# Patient Record
Sex: Female | Born: 2000 | Race: White | Hispanic: No | Marital: Single | Smoking: Never smoker
Health system: Southern US, Community
[De-identification: ages and names within clinical notes are randomized; demographics above are authoritative.]

## PROBLEM LIST (undated history)

## (undated) DIAGNOSIS — F419 Anxiety disorder, unspecified: Secondary | ICD-10-CM

## (undated) DIAGNOSIS — F32A Depression, unspecified: Secondary | ICD-10-CM

---
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
## 2022-02-20 ENCOUNTER — Ambulatory Visit: Payer: BLUE CROSS/BLUE SHIELD | Admitting: Medical

## 2022-02-21 ENCOUNTER — Encounter: Payer: Self-pay | Admitting: Medical

## 2022-02-21 ENCOUNTER — Ambulatory Visit (INDEPENDENT_AMBULATORY_CARE_PROVIDER_SITE_OTHER): Payer: BLUE CROSS/BLUE SHIELD | Admitting: Medical

## 2022-02-21 ENCOUNTER — Other Ambulatory Visit: Payer: Self-pay

## 2022-02-21 VITALS — BP 108/68 | HR 88 | Temp 97.7°F | Ht 63.39 in | Wt 151.0 lb

## 2022-02-21 DIAGNOSIS — Z20822 Contact with and (suspected) exposure to covid-19: Secondary | ICD-10-CM

## 2022-02-21 NOTE — Progress Notes (Signed)
[facility] [address] [place], [place] [address] Phone: [phone] Fax: [phone]   Office Visit Note  Patient Name: Nicole Li  Date of [id number]  Med Rec number [id number]  Date of Service: 02/21/2022  [place]  Chief Complaint  Patient presents with   Covid Exposure    02/19/22    HPI Patient states she was exposed to friend 2 days ago (spent about 1-2 hours seated on same sofa) who has since been diagnosed with COVID-19. Friend developed symptoms 12-24 hours later. Patient is not vaccinated for COVID-19. Patient currently denies any symptoms. Denies any chronic medical problems or immune compromising conditions.   Current Medication:  Outpatient Encounter Medications as of 02/21/2022  Medication Sig   [DISCONTINUED] albuterol (VENTOLIN HFA) 108 (90 Base) MCG/ACT inhaler SMARTSIG:1-2 Puff(s) By Mouth Every 4-6 Hours PRN (Patient not taking: Reported on 02/21/2022)   [DISCONTINUED] FLUoxetine (PROZAC) 20 MG capsule Take 20 mg by mouth daily. (Patient not taking: Reported on 02/21/2022)   [DISCONTINUED] FLUoxetine (PROZAC) 40 MG capsule Take 40 mg by mouth daily. (Patient not taking: Reported on 02/21/2022)   [DISCONTINUED] methocarbamol (ROBAXIN) 500 MG tablet Take 1 tablet (500 mg total) by mouth 2 (two) times daily as needed for muscle spasms. (Patient not taking: Reported on 02/21/2022)   [DISCONTINUED] traMADol (ULTRAM) 50 MG tablet Take 1 tablet (50 mg total) by mouth every 6 (six) hours as needed. (Patient not taking: Reported on 02/21/2022)   No facility-administered encounter medications on file as of 02/21/2022.      Medical History: Past Medical History:  Diagnosis Date   Anxiety    Depression      Vital Signs: BP 108/68   Pulse 88   Temp 97.7 F (36.5 C) (Tympanic)   Ht 5' 3.39" (1.61 m)   Wt 151 lb (68.5 kg)   SpO2 98%   BMI 26.42 kg/m    Review of Systems  Constitutional:  Negative for chills, fatigue and fever.   HENT:  Negative for congestion, rhinorrhea and sore throat.   Eyes:  Negative for redness.  Respiratory:  Negative for cough.   Gastrointestinal:  Negative for diarrhea, nausea and vomiting.  Musculoskeletal:  Negative for myalgias.  Neurological:  Negative for headaches.    Physical Exam Constitutional:      General: She is not in acute distress.    Appearance: She is not ill-appearing.  HENT:     Mouth/Throat:     Mouth: Mucous membranes are moist.     Pharynx: Posterior oropharyngeal erythema (slight) present. No pharyngeal swelling.     Comments: Tonsils not enlarged/inflamed, no exudate. Musculoskeletal:     Cervical back: Neck supple.  Lymphadenopathy:     Cervical: No cervical adenopathy.  Neurological:     Mental Status: She is alert.     Assessment/Plan: 1. Contact with and (suspected) exposure to covid-19 -POCT COVID-19 antigen test performed today; RESULT: NEGATIVE; Patient not charged for test because test provided by [facility]. -Per CDC guidelines for COVID-19 close contacts without symptoms, you should repeat a rapid COVID-19 antigen test (may return to clinic for this or use at home test) or PCR test 5 days after your last close contact (02/24/22).  -You should wear a well-fitting mask for 10 days after your last close contact when in close contact with others. -Monitor yourself for symptoms (I.e. headache, fever, chills, sore throat, nasal congestion, runny nose). If you do develop symptoms, you should retest yourself. -Send my chart message to  provider with any questions or concerns.   General Counseling: Keyle verbalizes understanding of the findings of todays visit and agrees with plan of treatment. I have discussed any further diagnostic evaluation that may be needed or ordered today. We also reviewed her medications today. she has been encouraged to call the office with any questions or concerns that should arise related to todays visit.   No orders  of the defined types were placed in this encounter.   No orders of the defined types were placed in this encounter.   Time spent: 15 Minutes    [place] Tapp [employer] PA-C [facility] 02/21/2022 8:50 AM

## 2022-02-21 NOTE — Patient Instructions (Signed)
-  Per CDC guidelines for COVID-19 close contacts without symptoms, you should repeat a rapid COVID-19 antigen test (may return to clinic for this or use at home test) or PCR test 5 days after your last close contact (02/24/22).  -You should wear a well-fitting mask for 10 days after your last close contact when in close contact with others. -Monitor yourself for symptoms (I.e. headache, fever, chills, sore throat, nasal congestion, runny nose). If you do develop symptoms, you should retest yourself. -Send my chart message to provider with any questions or concerns.

## 2022-08-15 ENCOUNTER — Other Ambulatory Visit: Payer: Self-pay

## 2022-08-15 ENCOUNTER — Ambulatory Visit (INDEPENDENT_AMBULATORY_CARE_PROVIDER_SITE_OTHER): Payer: BLUE CROSS/BLUE SHIELD | Admitting: Medical

## 2022-08-15 ENCOUNTER — Encounter: Payer: Self-pay | Admitting: Medical

## 2022-08-15 VITALS — BP 110/74 | HR 102 | Temp 98.2°F | Ht 63.39 in | Wt 150.0 lb

## 2022-08-15 DIAGNOSIS — J069 Acute upper respiratory infection, unspecified: Secondary | ICD-10-CM

## 2022-08-15 NOTE — Progress Notes (Signed)
[address] [place], [place] [address] Phone: [phone] Fax: [phone]   Office Visit Note  Patient Name: Nicole Li  Date of [id number]  Med Rec number [id number]  Date of Service: 08/15/2022  Allergies: Penicillins  Chief Complaint  Patient presents with   sick     HPI 22 y.o. college student presents with sore throat and productive cough.  Sx began when she woke up 2 days ago. Has had sore throat, mildly painful to swallow. Has nasal congestion and runny nose. Some green nasal d/c. Cough has been somewhat productive of this as well. No fever or chills. Mild HA, no myalgias. No nausea, vomiting or diarrhea. Mild fatigue but not significantly more than usual.  Advil yesterday, no other meds.   Denies sick contacts. Had flu-like sx few weeks ago, roommate and friends had flu at the time so assumed she did too. Sx had resolved.    Current Medication:  Outpatient Encounter Medications as of 08/15/2022  Medication Sig   amphetamine-dextroamphetamine (ADDERALL) 20 MG tablet Take 20 mg by mouth daily.   venlafaxine XR (EFFEXOR-XR) 75 MG 24 hr capsule Take 75 mg by mouth daily.   No facility-administered encounter medications on file as of 08/15/2022.      Medical History: Past Medical History:  Diagnosis Date   Anxiety    Depression      Vital Signs: BP 110/74   Pulse (!) 102   Temp 98.2 F (36.8 C) (Tympanic)   Ht 5' 3.39" (1.61 m)   Wt 150 lb (68 kg)   SpO2 98%   BMI 26.25 kg/m    Review of Systems See HPI  Physical Exam Vitals reviewed.  Constitutional:      General: She is not in acute distress.    Appearance: She is not ill-appearing.     Comments: Tired appearing  HENT:     Head: Normocephalic.     Right Ear: Tympanic membrane, ear canal and external ear normal.     Left Ear: Tympanic membrane, ear canal and external ear normal.     Nose: Mucosal edema and congestion (mild) present. No rhinorrhea.      Right Sinus: No maxillary sinus tenderness or frontal sinus tenderness.     Left Sinus: No maxillary sinus tenderness or frontal sinus tenderness.     Mouth/Throat:     Mouth: Mucous membranes are moist. No oral lesions.     Pharynx: Posterior oropharyngeal erythema (mild) present. No pharyngeal swelling.     Tonsils: No tonsillar exudate. 1+ on the right. 1+ on the left.  Cardiovascular:     Rate and Rhythm: Normal rate and regular rhythm.     Heart sounds: No murmur heard.    No friction rub. No gallop.  Pulmonary:     Effort: Pulmonary effort is normal.     Breath sounds: Normal breath sounds. No wheezing, rhonchi or rales.  Musculoskeletal:     Cervical back: Neck supple. No rigidity.  Lymphadenopathy:     Cervical: Cervical adenopathy (small anterior cervical nodes, slightly tender) present.  Neurological:     Mental Status: She is alert.       Assessment/Plan: 1. Acute upper respiratory infection Suspect viral infection. Did discuss option for rapid strep/COVID-19/flu testing, pt declined. Discussed supportive and symptomatic treatment.  Patient Instructions:  -Rest and stay well hydrated (by drinking water and other liquids). Avoid/limit caffeine. -Take over-the-counter medicines (i.e. Dayquil, Nyquil, Ibuprofen) to help relieve your symptoms. -For your  sore throat/cough, use cough drops/throat lozenges, gargle warm salt water and/or drink warm liquids (like tea with honey). -Send MyChart message to provider or schedule return visit as needed for new/worsening symptoms (i.e. fever, increasing throat pain/difficulty swallowing) or if symptoms do not improve as discussed with recommended treatment over next 5-7 days.    General Counseling: Audrey verbalizes understanding of the findings of todays visit and agrees with plan of treatment. she has been encouraged to call the office with any questions or concerns that should arise related to todays visit.   Time spent:20  [place] PA-C [facility] 08/15/2022 4:20 PM

## 2022-08-20 NOTE — Patient Instructions (Signed)
-  Rest and stay well hydrated (by drinking water and other liquids). Avoid/limit caffeine. -Take over-the-counter medicines (i.e. Dayquil, Nyquil, Ibuprofen) to help relieve your symptoms. -For your sore throat/cough, use cough drops/throat lozenges, gargle warm salt water and/or drink warm liquids (like tea with honey). -Send MyChart message to provider or schedule return visit as needed for new/worsening symptoms (i.e. fever, increasing throat pain/difficulty swallowing) or if symptoms do not improve as discussed with recommended treatment over next 5-7 days.

## 2022-08-21 ENCOUNTER — Encounter: Payer: Self-pay | Admitting: Medical

## 2022-08-21 ENCOUNTER — Ambulatory Visit (INDEPENDENT_AMBULATORY_CARE_PROVIDER_SITE_OTHER): Payer: BLUE CROSS/BLUE SHIELD | Admitting: Medical

## 2022-08-21 ENCOUNTER — Other Ambulatory Visit: Payer: Self-pay

## 2022-08-21 VITALS — BP 108/73 | HR 83 | Temp 97.5°F | Ht 63.39 in | Wt 150.0 lb

## 2022-08-21 DIAGNOSIS — K1230 Oral mucositis (ulcerative), unspecified: Secondary | ICD-10-CM

## 2022-08-21 MED ORDER — NYSTATIN 100000 UNIT/ML MT SUSP: 5.0000 mL | Freq: Four times a day (QID) | OROMUCOSAL | 0 refills | Status: DC | PRN

## 2022-08-21 NOTE — Progress Notes (Unsigned)
[facility] [place], [place] [address] Phone: [phone] Fax: [phone]   Office Visit Note  Patient Name: Nicole Li  Date of [id number]  Med Rec number [id number]  Date of Service: 08/21/2022  Allergies: Penicillins  Chief Complaint  Patient presents with   sick     HPI 22 y.o. college student presents with mouth/throat pain.  Seen 6 days ago with URI sx. Noting increased pain to throat on right side in last 2-3 days. Tonsils not inflamed, notes red bump inside mouth. Pain radiates to right ear somewhat. No fever or chills. Nasal congestion, rhinorrhea and cough have all improved. No increased fatigue. Some painful to swallow.   No otc meds currently.   Current Medication:  Outpatient Encounter Medications as of 08/21/2022  Medication Sig   amphetamine-dextroamphetamine (ADDERALL) 20 MG tablet Take 20 mg by mouth daily.   venlafaxine XR (EFFEXOR-XR) 75 MG 24 hr capsule Take 75 mg by mouth daily.   No facility-administered encounter medications on file as of 08/21/2022.      Medical History: Past Medical History:  Diagnosis Date   Anxiety    Depression      Vital Signs: BP 108/73   Pulse 83   Temp (!) 97.5 F (36.4 C) (Tympanic)   Ht 5' 3.39" (1.61 m)   Wt 150 lb (68 kg)   SpO2 98%   BMI 26.25 kg/m    Review of Systems See HPI  Physical Exam Vitals reviewed.  Constitutional:      General: She is not in acute distress.    Appearance: She is not ill-appearing.     Comments: Tired appearing  HENT:     Head: Normocephalic.     Right Ear: Tympanic membrane, ear canal and external ear normal.     Left Ear: Tympanic membrane, ear canal and external ear normal.     Nose: No congestion or rhinorrhea.     Mouth/Throat:     Mouth: Mucous membranes are moist.     Pharynx: No pharyngeal swelling or posterior oropharyngeal erythema.     Tonsils: No tonsillar exudate. 1+ on the right. 1+ on the left.     Comments:  Mild erythema to right palatoglossal arch area. No specific lesions noted. Musculoskeletal:     Cervical back: Neck supple. No rigidity.  Lymphadenopathy:     Cervical: Cervical adenopathy (small anterior cervical nodes, slightly tender on right) present.  Neurological:     Mental Status: She is alert.     Assessment/Plan: 1. Oral mucositis Discussed overall reassuring exam findings. Offered option for rapid strep A antigen test, patient declined. Will treat supportively and try Magic Mouthwash for relief of pain/inflammation.   - magic mouthwash (nystatin, hydrocortisone, diphenhydrAMINE) suspension; Swish and spit 5 mLs 4 (four) times daily as needed for mouth pain.  Dispense: 240 mL; Refill: 0  Patient Instructions:  -Try "magic mouthwash" for mouth pain after meals and before bedtime. --Rest and stay well hydrated (by drinking water and other liquids). ] -Take over-the-counter medicines (i.e. Ibuprofen) to help relieve your symptoms. -For your sore throat/mouth, use throat lozenges, gargle warm salt water and/or drink warm liquids (like tea with honey). -Send MyChart message to provider or schedule return visit as needed for new/worsening symptoms (i.e. fever, increasing throat pain) or if symptoms do not improve as discussed with recommended treatment over next 5-7 days.     General Counseling: Nicole Li verbalizes understanding of the findings of todays visit and agrees with  plan of treatment. she has been encouraged to call the office with any questions or concerns that should arise related to todays visit.   Time spent:20 [place] PA-C [place] 08/21/2022 9:52 AM

## 2022-08-24 ENCOUNTER — Encounter: Payer: Self-pay | Admitting: Medical

## 2022-08-24 NOTE — Patient Instructions (Signed)
-  Try "magic mouthwash" for mouth pain after meals and before bedtime. --Rest and stay well hydrated (by drinking water and other liquids). ] -Take over-the-counter medicines (i.e. Ibuprofen) to help relieve your symptoms. -For your sore throat/mouth, use throat lozenges, gargle warm salt water and/or drink warm liquids (like tea with honey). -Send MyChart message to provider or schedule return visit as needed for new/worsening symptoms (i.e. fever, increasing throat pain) or if symptoms do not improve as discussed with recommended treatment over next 5-7 days.

## 2022-08-25 ENCOUNTER — Ambulatory Visit (INDEPENDENT_AMBULATORY_CARE_PROVIDER_SITE_OTHER): Payer: BLUE CROSS/BLUE SHIELD | Admitting: Medical

## 2022-08-25 ENCOUNTER — Encounter: Payer: Self-pay | Admitting: Medical

## 2022-08-25 ENCOUNTER — Other Ambulatory Visit: Payer: Self-pay

## 2022-08-25 VITALS — BP 108/61 | HR 88 | Temp 98.4°F | Ht 63.39 in | Wt 150.0 lb

## 2022-08-25 DIAGNOSIS — J029 Acute pharyngitis, unspecified: Secondary | ICD-10-CM

## 2022-08-25 LAB — POCT RAPID STREP A (OFFICE): Rapid Strep A Screen: NEGATIVE

## 2022-08-25 NOTE — Progress Notes (Signed)
[facility]. [place], [place] [address] Phone: [phone] Fax: [phone]   Office Visit Note  Patient Name: Nicole Li  Date of [id number]  Med Rec number [id number]  Date of Service: 08/25/2022  Allergies: Penicillins  Chief Complaint  Patient presents with   Sore Throat     HPI 22 y.o. college student presents for follow up of throat pain.  See previous notes. Throat hurting same as it was few days ago, hurts to swallow. No fever or chills. Right ear also some painful, not clogged or congested. Not more fatigued than usual.   Has never had mono. Had strep throat in Dec.   No otc analgesics, Magic mouthwash given at prior visit not very helpful.    Current Medication:  Outpatient Encounter Medications as of 08/25/2022  Medication Sig   amphetamine-dextroamphetamine (ADDERALL) 20 MG tablet Take 20 mg by mouth daily.   magic mouthwash (nystatin, hydrocortisone, diphenhydrAMINE) suspension Swish and spit 5 mLs 4 (four) times daily as needed for mouth pain.   venlafaxine XR (EFFEXOR-XR) 75 MG 24 hr capsule Take 75 mg by mouth daily.   No facility-administered encounter medications on file as of 08/25/2022.      Medical History: Past Medical History:  Diagnosis Date   Anxiety    Depression      Vital Signs: BP 108/61   Pulse 88   Temp 98.4 F (36.9 C) (Tympanic)   Ht 5' 3.39" (1.61 m)   Wt 150 lb (68 kg)   SpO2 99%   BMI 26.25 kg/m    Review of Systems See HPI  Physical Exam Vitals reviewed.  Constitutional:      General: She is not in acute distress.    Appearance: She is not ill-appearing.  HENT:     Head: Normocephalic.     Right Ear: Ear canal and external ear normal.     Left Ear: Tympanic membrane, ear canal and external ear normal.     Ears:     Comments: Right TM dull, small serous middle ear fluid on right    Nose: Mucosal edema (mild) present. No congestion or rhinorrhea.     Mouth/Throat:     Mouth:  Mucous membranes are moist. No oral lesions.     Pharynx: Posterior oropharyngeal erythema (slight generalized erythema to pharynx) present. No pharyngeal swelling.     Tonsils: No tonsillar exudate. 1+ on the right. 1+ on the left.     Comments: Mildly tender to right palatoglossal arch Cardiovascular:     Rate and Rhythm: Normal rate and regular rhythm.     Heart sounds: No murmur heard.    No friction rub. No gallop.  Pulmonary:     Effort: Pulmonary effort is normal.     Breath sounds: Normal breath sounds. No wheezing, rhonchi or rales.  Musculoskeletal:     Cervical back: Neck supple. No rigidity.  Lymphadenopathy:     Cervical: Cervical adenopathy (1+ anterior nodes, tender on right.) present.  Neurological:     Mental Status: She is alert.    Results for orders placed or performed in visit on 08/25/22 (from the past 24 hour(s))  POCT rapid strep A     Status: Normal   Collection Time: 08/25/22  2:15 PM  Result Value Ref Range   Rapid Strep A Screen Negative Negative      Assessment/Plan: 1. Pharyngitis, unspecified etiology POC Strep A antigen test negative. Exam stable from previous visit. No obvious source  for pain identified on exam. Will check strep culture. Encouraged over-the-counter anti-inflammatory medicine and supportive measures.   - POCT rapid strep A - Culture, Group A Strep   Patient Instructions:  -Take an over-the-counter pain reliever/anti-inflammatory (such as ibuprofen) to help relieve pain or fever.   -You may continue to use the Magic Mouthwash as needed. -Rest and drink plenty of water.  Drinking warm or cold liquids (such as tea, soup or smoothies) and eating soft foods (such as oatmeal) may be more comfortable until your throat pain improves.    -You will receive a MyChart message notifying you of your lab results when they are available.  -Send MyChart message to provider or schedule return appointment in meantime as needed for new/worsening  symptoms (such as increased throat pain or difficulty swallowing, fever).   General Counseling: Anahita verbalizes understanding of the findings of todays visit and agrees with plan of treatment. she has been encouraged to call the office with any questions or concerns that should arise related to todays visit.   Time spent:20 [place] PA-C [place] 08/25/2022 1:56 PM

## 2022-08-28 LAB — CULTURE, GROUP A STREP: Strep A Culture: NEGATIVE

## 2022-09-01 ENCOUNTER — Encounter: Payer: Self-pay | Admitting: Medical

## 2022-09-01 NOTE — Patient Instructions (Signed)
-  Take an over-the-counter pain reliever/anti-inflammatory (such as ibuprofen) to help relieve pain or fever.   -You may continue to use the Magic Mouthwash as needed. -Rest and drink plenty of water.  Drinking warm or cold liquids (such as tea, soup or smoothies) and eating soft foods (such as oatmeal) may be more comfortable until your throat pain improves.    -You will receive a MyChart message notifying you of your lab results when they are available.  -Send MyChart message to provider or schedule return appointment in meantime as needed for new/worsening symptoms (such as increased throat pain or difficulty swallowing, fever).

## 2022-09-29 ENCOUNTER — Ambulatory Visit: Payer: BLUE CROSS/BLUE SHIELD | Admitting: Oncology

## 2022-10-10 ENCOUNTER — Other Ambulatory Visit: Payer: Self-pay

## 2022-10-10 ENCOUNTER — Encounter: Payer: Self-pay | Admitting: Medical

## 2022-10-10 ENCOUNTER — Ambulatory Visit (INDEPENDENT_AMBULATORY_CARE_PROVIDER_SITE_OTHER): Payer: BLUE CROSS/BLUE SHIELD | Admitting: Medical

## 2022-10-10 VITALS — BP 110/78 | HR 94 | Temp 97.8°F | Wt 142.0 lb

## 2022-10-10 DIAGNOSIS — R112 Nausea with vomiting, unspecified: Secondary | ICD-10-CM

## 2022-10-10 DIAGNOSIS — R509 Fever, unspecified: Secondary | ICD-10-CM

## 2022-10-10 DIAGNOSIS — R1030 Lower abdominal pain, unspecified: Secondary | ICD-10-CM | POA: Diagnosis not present

## 2022-10-10 DIAGNOSIS — J029 Acute pharyngitis, unspecified: Secondary | ICD-10-CM

## 2022-10-10 DIAGNOSIS — Z113 Encounter for screening for infections with a predominantly sexual mode of transmission: Secondary | ICD-10-CM

## 2022-10-10 LAB — POCT URINALYSIS DIPSTICK (MANUAL)
Nitrite, UA: NEGATIVE
Poct Bilirubin: NEGATIVE
Poct Glucose: NORMAL mg/dL
Poct Protein: 30 mg/dL — AB
Poct Urobilinogen: NORMAL mg/dL
Spec Grav, UA: 1.02 (ref 1.010–1.025)
pH, UA: 6.5 (ref 5.0–8.0)

## 2022-10-10 LAB — POCT RAPID STREP A (OFFICE): Rapid Strep A Screen: NEGATIVE

## 2022-10-10 MED ORDER — CEFDINIR 300 MG PO CAPS: 300.0000 mg | ORAL_CAPSULE | Freq: Two times a day (BID) | ORAL | 0 refills | Status: AC

## 2022-10-10 MED ORDER — ONDANSETRON 4 MG PO TBDP: 4.0000 mg | ORAL_TABLET | Freq: Three times a day (TID) | ORAL | 0 refills | Status: AC | PRN

## 2022-10-10 NOTE — Progress Notes (Signed)
[facility] [address] [place], [place] [address] Phone: [phone] Fax: [phone]   Office Visit Note  Patient Name: Nicole Li  Date of [id number]  Med Rec number [id number]  Date of Service: 10/10/2022  Allergies: Penicillins  Chief Complaint  Patient presents with   sick     HPI 22 y.o. college student presents with vomiting and fever.  Felt normal yesterday AM. Drank smoothie and ate sandwich from [employer] last night. Had abrupt onset of nausea and then vomited about half hour later. Vomited twice more last night. Had eaten crackers and ginger ale. Also developed fever (101+) last night and again this AM. Sweating overnight.  Mild nausea today, no further vomiting. Had diarrhea x 1 yesterday evening. Also developed sore throat last night with painful swallowing. Also has HA and myalgias. Mild nasal congestion. No cough.  Denies sick contacts. Does not recall sharing anything. Bites her nails often.   Has had some lower back and abdomen pain for about 3 days, better as of yesterday afternoon. No dysuria, urgency, frequency or vaginal discharge. Has had UTI once before. No new sexual partners. Has not had sex last 6 weeks. Thinks she had STI testing 2-3 years ago.  Taking Tylenol and Advil. Last Tylenol about 3 hrs ago. No recent abx.    Current Medication:  Outpatient Encounter Medications as of 10/10/2022  Medication Sig   amphetamine-dextroamphetamine (ADDERALL) 20 MG tablet Take 20 mg by mouth daily.   venlafaxine XR (EFFEXOR-XR) 75 MG 24 hr capsule Take 75 mg by mouth daily.   [DISCONTINUED] magic mouthwash (nystatin, hydrocortisone, diphenhydrAMINE) suspension Swish and spit 5 mLs 4 (four) times daily as needed for mouth pain.   No facility-administered encounter medications on file as of 10/10/2022.      Medical History: Past Medical History:  Diagnosis Date   Anxiety    Depression      Vital Signs: BP 110/78   Pulse 94    Temp 97.8 F (36.6 C) (Tympanic)   Wt 142 lb (64.4 kg)   SpO2 98%   BMI 24.85 kg/m    Review of Systems See HPI  Physical Exam Vitals reviewed.  Constitutional:      General: She is not in acute distress.    Appearance: She is not ill-appearing.     Comments: Tired appearing  HENT:     Head: Normocephalic.     Right Ear: Tympanic membrane, ear canal and external ear normal.     Left Ear: Tympanic membrane, ear canal and external ear normal.     Nose: No mucosal edema, congestion or rhinorrhea.     Mouth/Throat:     Mouth: Mucous membranes are moist. No oral lesions.     Pharynx: Posterior oropharyngeal erythema (mild) present. No pharyngeal swelling.     Tonsils: No tonsillar exudate. 1+ on the right. 1+ on the left.  Cardiovascular:     Rate and Rhythm: Normal rate and regular rhythm.     Heart sounds: No murmur heard.    No friction rub. No gallop.  Pulmonary:     Effort: Pulmonary effort is normal.     Breath sounds: Normal breath sounds. No wheezing, rhonchi or rales.  Abdominal:     General: Abdomen is flat. Bowel sounds are normal. There is no distension.     Palpations: Abdomen is soft.     Tenderness: There is generalized abdominal tenderness (mild). There is right CVA tenderness (slight) and left CVA tenderness (slight). There  is no guarding.  Musculoskeletal:     Cervical back: Neck supple. No rigidity.  Lymphadenopathy:     Cervical: Cervical adenopathy (small anterior nodes, slightly tender) present.  Neurological:     Mental Status: She is alert.     Results for orders placed or performed in visit on 10/10/22 (from the past 24 hour(s))  POCT rapid strep A     Status: Normal   Collection Time: 10/10/22  2:39 PM  Result Value Ref Range   Rapid Strep A Screen Negative Negative  POCT Urinalysis Dip Manual     Status: Abnormal   Collection Time: 10/10/22  3:16 PM  Result Value Ref Range   Spec Grav, UA 1.020 1.010 - 1.025   pH, UA 6.5 5.0 - 8.0    Leukocytes, UA Moderate (2+) (A) Negative   Nitrite, UA Negative Negative   Poct Protein +30 (A) Negative, trace mg/dL   Poct Glucose Normal Normal mg/dL   Poct Ketones ++ moderate (A) Negative   Poct Urobilinogen Normal Normal mg/dL   Poct Bilirubin Negative Negative   Poct Blood trace Negative, trace     Assessment/Plan: 1. Fever, unspecified fever cause 2. Pharyngitis, unspecified etiology 3. Lower abdominal pain 4. Nausea and vomiting, unspecified vomiting type 5. Screening examination for STD (sexually transmitted disease) Clinical findings and urinalysis result concerning for possible urinary tract infection. Early respiratory infection also possible. Will cover with antibiotics. Will give Zofran to take as needed for nausea and vomiting. Also discussed over-the-counter decongestant and analgesic as needed. Will send urine for culture and STI screening.    - POCT rapid strep A - POCT Urinalysis Dip Manual - Chlamydia/Gonococcus/Trichomonas, NAA - Urine Culture - cefdinir (OMNICEF) 300 MG capsule; Take 1 capsule (300 mg total) by mouth 2 (two) times daily for 7 days.  Dispense: 14 capsule; Refill: 0 - ondansetron (ZOFRAN-ODT) 4 MG disintegrating tablet; Take 1 tablet (4 mg total) by mouth every 8 (eight) hours as needed for nausea or vomiting.  Dispense: 15 tablet; Refill: 0  Patient Instructions:  -Take complete course of antibiotics as prescribed.  Take with food.  -Use Ondansetron every 8 hours as needed for nausea or vomiting.  -Drink plenty of water. Avoid or limit alcohol and caffeine, which may make symptoms worse. -Avoid spicy, fried or acidic (i.e. tomato, tomato sauce, citrus) foods. -Take over-the-counter medicines (i.e. Tylenol) to help relieve your symptoms.  -You will receive a MyChart message notifying you of your lab results when they are available.  -Send MyChart message to provider or schedule return appointment as needed for new/worsening symptoms (such as  increased pain, persistent vomiting) if your symptoms are not improving after 2-3 days taking antibiotics.   General Counseling: Nicole Li verbalizes understanding of the findings of todays visit and agrees with plan of treatment. she has been encouraged to call the office with any questions or concerns that should arise related to todays visit.   Orders Placed This Encounter  Procedures   Chlamydia/Gonococcus/Trichomonas, NAA   Urine Culture   POCT rapid strep A   POCT Urinalysis Dip Manual    Meds ordered this encounter  Medications   cefdinir (OMNICEF) 300 MG capsule    Sig: Take 1 capsule (300 mg total) by mouth 2 (two) times daily for 7 days.    Dispense:  14 capsule    Refill:  0    Order Specific Question:   Supervising Provider    Answer:   Noralee Stain [[id number]]   ondansetron (  ZOFRAN-ODT) 4 MG disintegrating tablet    Sig: Take 1 tablet (4 mg total) by mouth every 8 (eight) hours as needed for nausea or vomiting.    Dispense:  15 tablet    Refill:  0    Order Specific Question:   Supervising Provider    Answer:   Noralee Stain [[id number]]    Time spent:25 Minutes    Jonathon Resides PA-C [facility] 10/10/2022 2:14 PM

## 2022-10-11 ENCOUNTER — Other Ambulatory Visit: Payer: Self-pay

## 2022-10-11 ENCOUNTER — Emergency Department: Payer: BLUE CROSS/BLUE SHIELD

## 2022-10-11 ENCOUNTER — Emergency Department
Admission: EM | Admit: 2022-10-11 | Discharge: 2022-10-11 | Disposition: A | Discharge status: Home / Self Care | Payer: BLUE CROSS/BLUE SHIELD | Attending: Emergency Medicine | Admitting: Emergency Medicine

## 2022-10-11 DIAGNOSIS — R102 Pelvic and perineal pain: Secondary | ICD-10-CM | POA: Diagnosis present

## 2022-10-11 DIAGNOSIS — N390 Urinary tract infection, site not specified: Secondary | ICD-10-CM

## 2022-10-11 LAB — WET PREP, GENITAL
Clue Cells Wet Prep HPF POC: NONE SEEN
Sperm: NONE SEEN
Trich, Wet Prep: NONE SEEN
WBC, Wet Prep HPF POC: >=10 — AB (ref <10)
Yeast Wet Prep HPF POC: NONE SEEN

## 2022-10-11 LAB — URINALYSIS, ROUTINE W REFLEX MICROSCOPIC
Bacteria, UA: NONE SEEN
Bilirubin Urine: NEGATIVE
Glucose, UA: NEGATIVE mg/dL
Hgb urine dipstick: NEGATIVE
Ketones, ur: 20 mg/dL — AB
Nitrite: NEGATIVE
Protein, ur: 30 mg/dL — AB
Specific Gravity, Urine: 1.031 — ABNORMAL HIGH (ref 1.005–1.030)
pH: 5 (ref 5.0–8.0)

## 2022-10-11 LAB — CBC
HCT: 38.9 % (ref 36.0–46.0)
Hemoglobin: 12.2 g/dL (ref 12.0–15.0)
MCH: 27.2 pg (ref 26.0–34.0)
MCHC: 31.4 g/dL (ref 30.0–36.0)
MCV: 86.8 fL (ref 80.0–100.0)
Platelets: 320 10*3/uL (ref 150–400)
RBC: 4.48 MIL/uL (ref 3.87–5.11)
RDW: 13.9 % (ref 11.5–15.5)
WBC: 17.5 10*3/uL — ABNORMAL HIGH (ref 4.0–10.5)
nRBC: 0 % (ref 0.0–0.2)

## 2022-10-11 LAB — COMPREHENSIVE METABOLIC PANEL
ALT: 13 U/L (ref 0–44)
AST: 20 U/L (ref 15–41)
Albumin: 4.4 g/dL (ref 3.5–5.0)
Alkaline Phosphatase: 81 U/L (ref 38–126)
Anion gap: 9 (ref 5–15)
BUN: 12 mg/dL (ref 6–20)
CO2: 24 mmol/L (ref 22–32)
Calcium: 9.1 mg/dL (ref 8.9–10.3)
Chloride: 105 mmol/L (ref 98–111)
Creatinine, Ser: 0.58 mg/dL (ref 0.44–1.00)
GFR, Estimated: >60 mL/min (ref >60)
Glucose, Bld: 110 mg/dL — ABNORMAL HIGH (ref 70–99)
Potassium: 3.7 mmol/L (ref 3.5–5.1)
Sodium: 138 mmol/L (ref 135–145)
Total Bilirubin: 0.5 mg/dL (ref 0.3–1.2)
Total Protein: 8 g/dL (ref 6.5–8.1)

## 2022-10-11 LAB — POC URINE PREG, ED: Preg Test, Ur: NEGATIVE

## 2022-10-11 LAB — LIPASE, BLOOD: Lipase: 38 U/L (ref 11–51)

## 2022-10-11 LAB — CHLAMYDIA/NGC RT PCR (ARMC ONLY)
Chlamydia Tr: NOT DETECTED
N gonorrhoeae: NOT DETECTED

## 2022-10-11 MED ORDER — SODIUM CHLORIDE 0.9 % IV BOLUS
500.0000 mL | Freq: Once | INTRAVENOUS | Status: AC
  Administered 2022-10-11: 500 mL via INTRAVENOUS

## 2022-10-11 MED ORDER — KETOROLAC TROMETHAMINE 30 MG/ML IJ SOLN
30.0000 mg | Freq: Once | INTRAMUSCULAR | Status: AC
  Administered 2022-10-11: 30 mg via INTRAVENOUS
  Filled 2022-10-11: qty 1

## 2022-10-11 NOTE — ED Provider Notes (Signed)
[facility] Provider Note    Event Date/Time   First MD Initiated Contact with Patient 10/11/22 0800     (approximate)   History   Abdominal Pain   HPI  Nicole Li is a 22 y.o. female with no history of abdominal surgery who presents with several days of abdominal pain.  She describes it started as lower abdominal pain, now predominantly in the left lower quadrant and some discomfort in the left flank.  She denies dysuria but reports decreased urine output.  No fevers reported.  No nausea or vomiting.  No history of kidney stones.     Physical Exam   Triage Vital Signs: ED Triage Vitals [10/11/22 0748]  Enc Vitals Group     BP 128/75     Pulse Rate 75     Resp 16     Temp 98.4 F (36.9 C)     Temp Source Oral     SpO2 100 %     Weight 59 kg (130 lb)     Height 1.6 m ( )     Head Circumference      Peak Flow      Pain Score 4     Pain Loc      Pain Edu?      Excl. in GC?     Most recent vital signs: Vitals:   10/11/22 0748  BP: 128/75  Pulse: 75  Resp: 16  Temp: 98.4 F (36.9 C)  SpO2: 100%     General: Awake, no distress.  CV:  Good peripheral perfusion.  Resp:  Normal effort.  Abd:  No distention.  Mild left CVA tenderness, mild left lower quadrant tenderness Other:     ED Results / Procedures / Treatments   Labs (all labs ordered are listed, but only abnormal results are displayed) Labs Reviewed  WET PREP, GENITAL - Abnormal; Notable for the following components:      Result Value   WBC, Wet Prep HPF POC >=10 (*)    All other components within normal limits  COMPREHENSIVE METABOLIC PANEL - Abnormal; Notable for the following components:   Glucose, Bld 110 (*)    All other components within normal limits  CBC - Abnormal; Notable for the following components:   WBC 17.5 (*)    All other components within normal limits  URINALYSIS, ROUTINE W REFLEX MICROSCOPIC - Abnormal; Notable for the following  components:   Color, Urine YELLOW (*)    APPearance HAZY (*)    Specific Gravity, Urine 1.031 (*)    Ketones, ur 20 (*)    Protein, ur 30 (*)    Leukocytes,Ua TRACE (*)    All other components within normal limits  CHLAMYDIA/NGC RT PCR ([facility] ONLY)            LIPASE, BLOOD  POC URINE PREG, ED     EKG     RADIOLOGY CT renal stone study viewed interpret by me, no ureterolithiasis, pending radiology review    PROCEDURES:  Critical Care performed:   Procedures   MEDICATIONS ORDERED IN ED: Medications  sodium chloride 0.9 % bolus 500 mL (0 mLs Intravenous Stopped 10/11/22 0848)  ketorolac (TORADOL) 30 MG/ML injection 30 mg (30 mg Intravenous Given 10/11/22 0848)     IMPRESSION / MDM / ASSESSMENT AND PLAN / ED COURSE  I reviewed the triage vital signs and the nursing notes. Patient's presentation is most consistent with acute presentation with potential threat to life or  bodily function.  Patient presents with lower abdominal pain predominantly on the left as described above.  Differential includes UTI, pyelonephritis, ectopic pregnancy, ureterolithiasis, less likely diverticulitis given her age  Will obtain labs, urinalysis, pregnancy test.  Will place IV give IV fluids and analgesics and consider imaging  CT renal stone study negative for ureterolithiasis, will send for ultrasound as she does describe some vaginal discharge  Urinalysis possibly consistent with UTI  Pelvic performed, some yellowish discharge, wet prep demonstrates elevated white blood cells  Ultrasound is negative for TOA or evidence of torsion  Gonorrhea chlamydia negative  Patient has prescription for antibiotics for UTI prescribed by urgent care, I recommend that she take these, she is feeling much improved, return precautions discussed, outpatient follow-up with GYN as needed        FINAL CLINICAL IMPRESSION(S) / ED DIAGNOSES   Final diagnoses:  Lower urinary tract infectious disease   Pelvic pain in female     Rx / DC Orders   ED Discharge Orders     None        Note:  This document was prepared using Dragon voice recognition software and may include unintentional dictation errors.   Jene Every, MD 10/11/22 1140

## 2022-10-11 NOTE — ED Triage Notes (Signed)
Pt states llq pain for several days with possible fever and vomiting x3 on Friday. Pt states pain radiates to her back, appears in no acute distress.

## 2022-10-12 DIAGNOSIS — N898 Other specified noninflammatory disorders of vagina: Secondary | ICD-10-CM

## 2022-10-12 LAB — CHLAMYDIA/GONOCOCCUS/TRICHOMONAS, NAA
Chlamydia by NAA: NEGATIVE
Gonococcus by NAA: NEGATIVE
Trich vag by NAA: NEGATIVE

## 2022-10-12 LAB — URINE CULTURE

## 2022-10-15 ENCOUNTER — Encounter: Payer: Self-pay | Admitting: Medical

## 2022-10-15 NOTE — Patient Instructions (Signed)
-  Take complete course of antibiotics as prescribed.  Take with food.  -Use Ondansetron every 8 hours as needed for nausea or vomiting.  -Drink plenty of water. Avoid or limit alcohol and caffeine, which may make symptoms worse. -Avoid spicy, fried or acidic (i.e. tomato, tomato sauce, citrus) foods. -Take over-the-counter medicines (i.e. Tylenol) to help relieve your symptoms.  -You will receive a MyChart message notifying you of your lab results when they are available.  -Send MyChart message to provider or schedule return appointment as needed for new/worsening symptoms (such as increased pain, persistent vomiting) if your symptoms are not improving after 2-3 days taking antibiotics.

## 2022-10-20 MED ORDER — FLUCONAZOLE 150 MG PO TABS
150.0000 mg | ORAL_TABLET | Freq: Once | ORAL | 0 refills | Status: AC
Start: 2022-10-20 — End: 2022-10-20

## 2024-03-02 IMAGING — CT CT ANGIO CHEST
2 of 6 series · 18 of 46 positions shown · IV contrast (APPLIED)
Comparison: Plain films of the ribs and chest of 09/12/2021.

CLINICAL DATA: Left rib pain for 3 weeks.

EXAM:
CT ANGIOGRAPHY CHEST WITH CONTRAST
TECHNIQUE: Multidetector CT imaging of the chest was performed using the
standard protocol during bolus administration of intravenous
contrast. Multiplanar CT image reconstructions and MIPs were
obtained to evaluate the vascular anatomy.

[Series 6: thins · axial · 0.62mm/px · z∈[-582,-354]mm · 15 of 358 slices shown]
[im 16/358  lung]
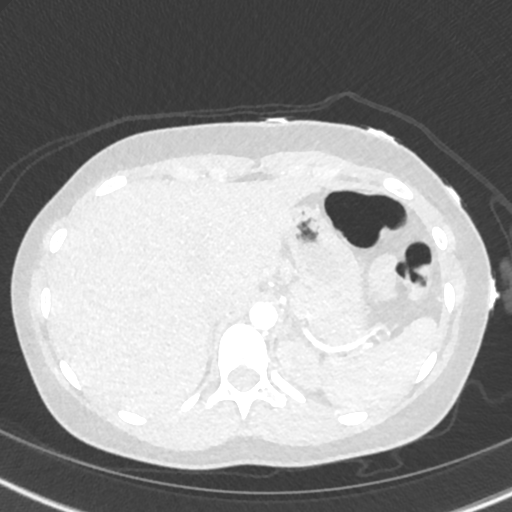
[im 47/358  soft-tissue]
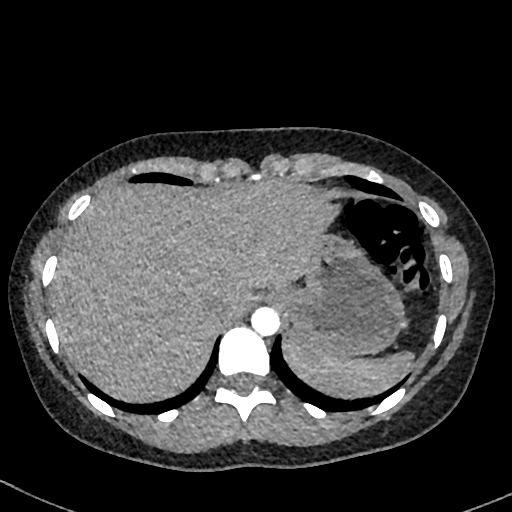
[im 63/358  lung]
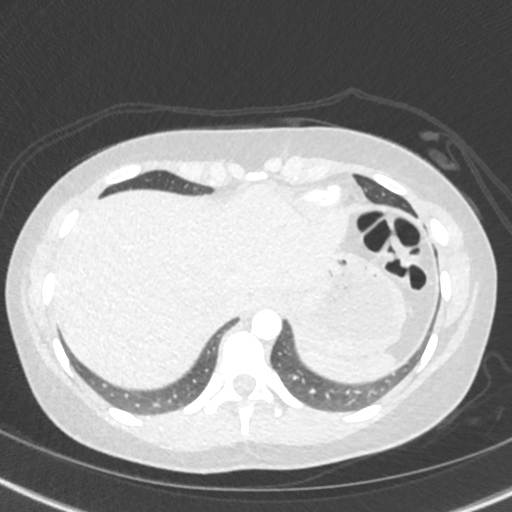
[im 94/358  soft-tissue]
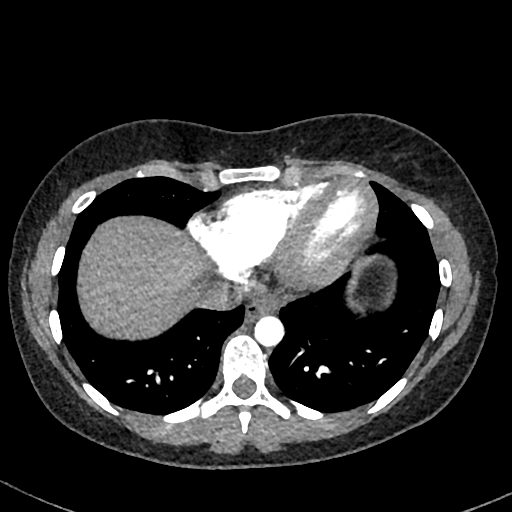
[im 109/358  lung]
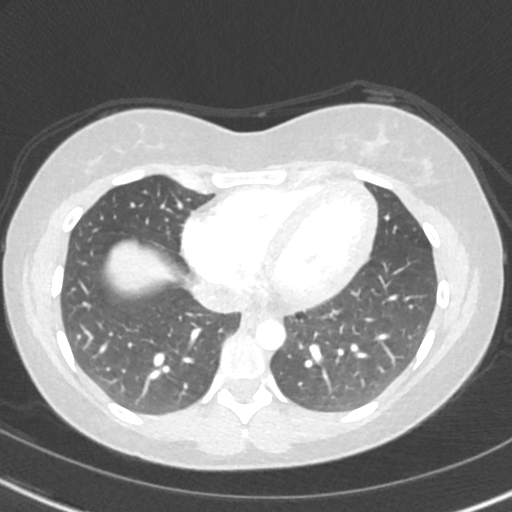
[im 140/358  soft-tissue]
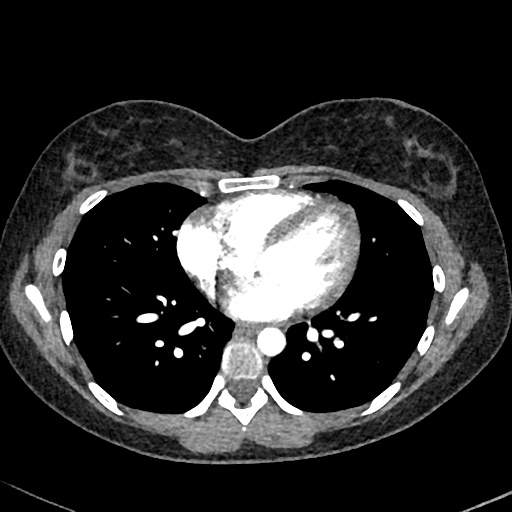
[im 156/358  lung]
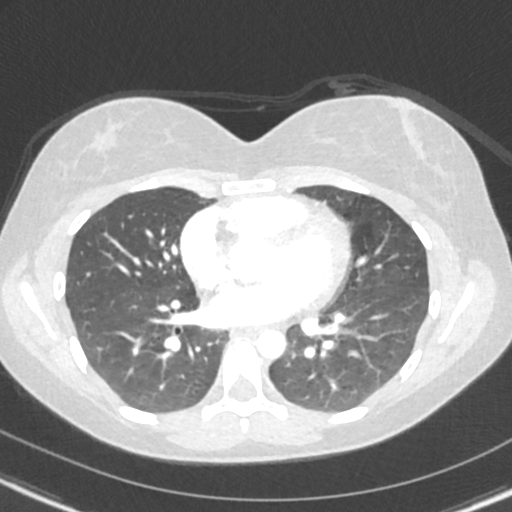
[im 187/358  soft-tissue]
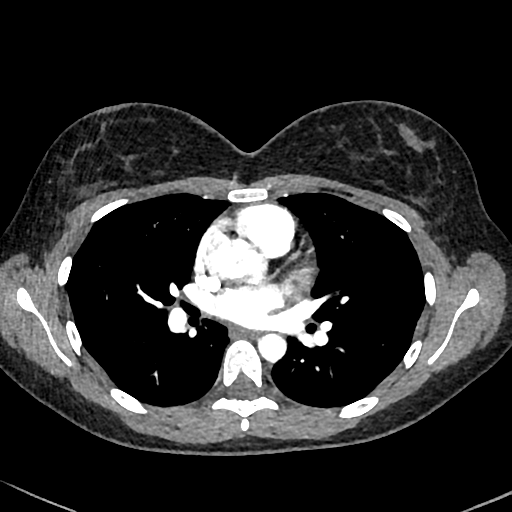
[im 202/358  lung]
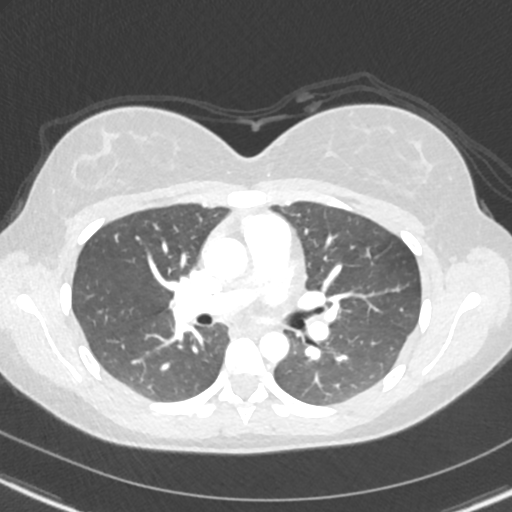
[im 218/358  soft-tissue]
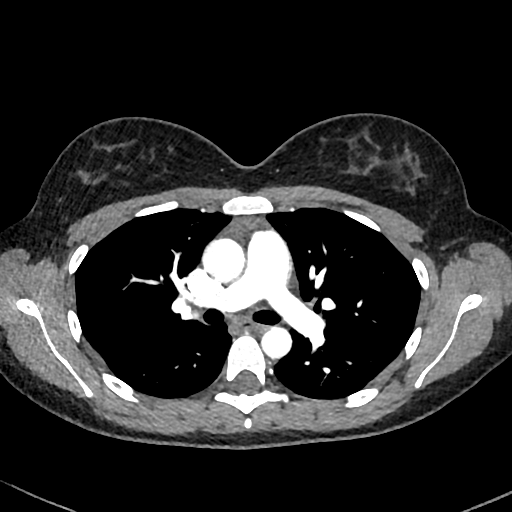
[im 249/358  lung]
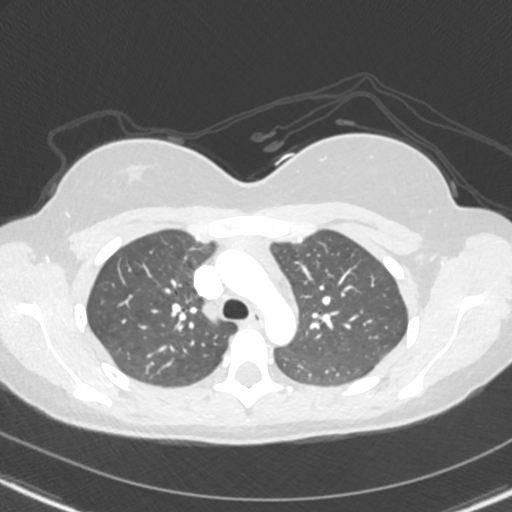
[im 264/358  soft-tissue]
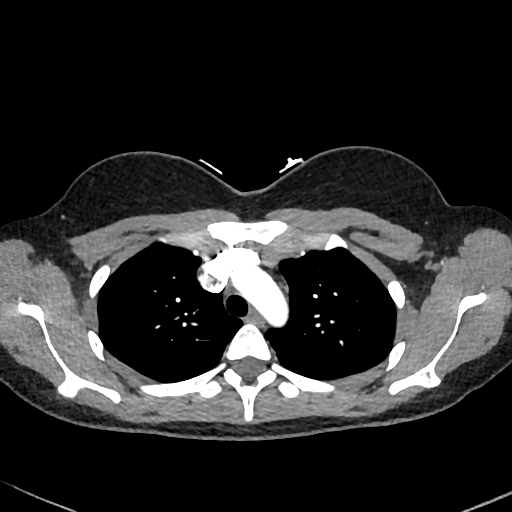
[im 295/358  lung]
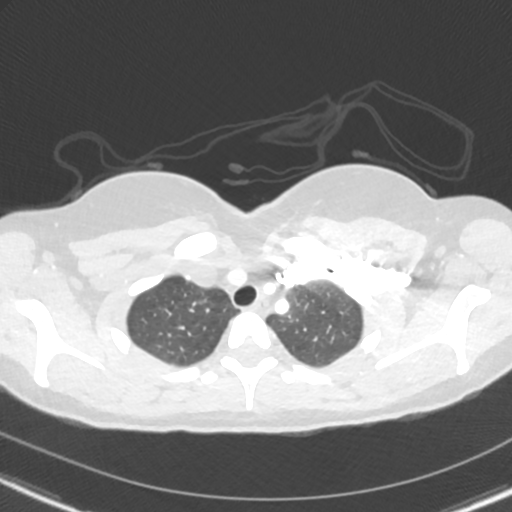
[im 311/358  soft-tissue]
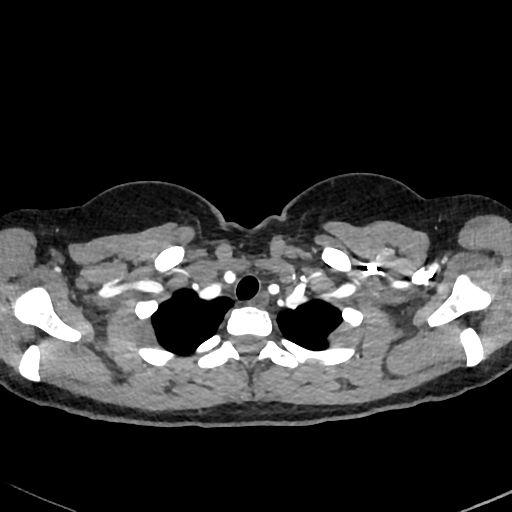
[im 342/358  lung]
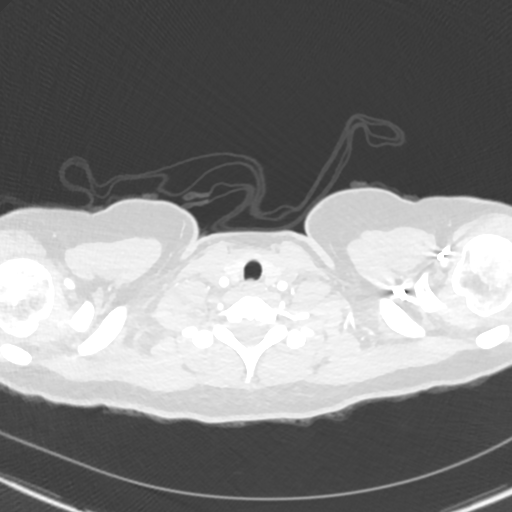

[Series 7: cor · coronal · 0.52mm/px · 3 of 116 slices shown]
[im 29/116  soft-tissue]
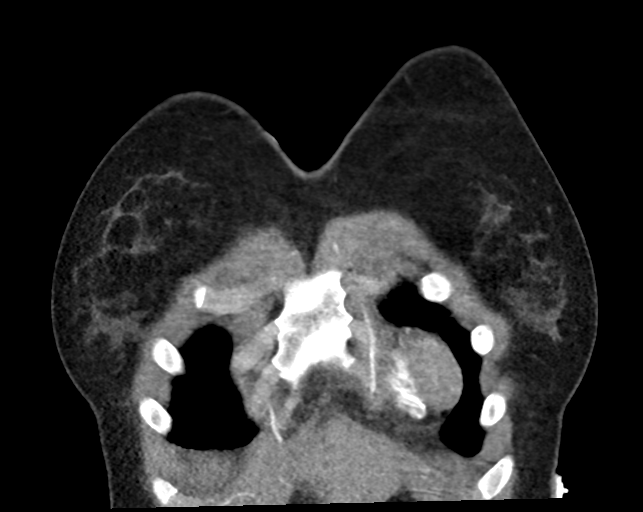
[im 58/116  soft-tissue]
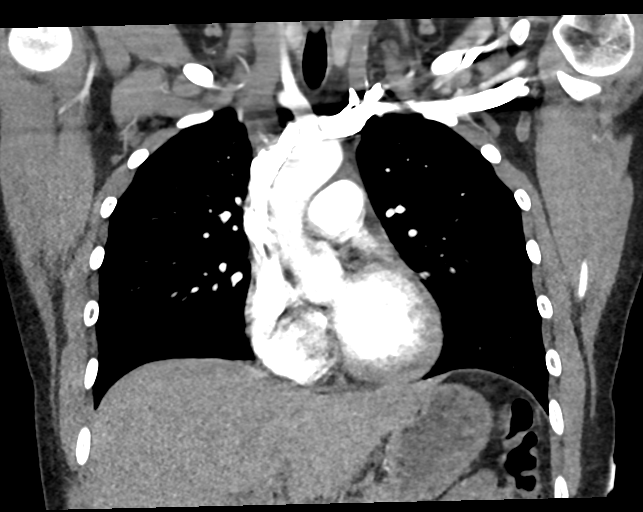
[im 87/116  soft-tissue]
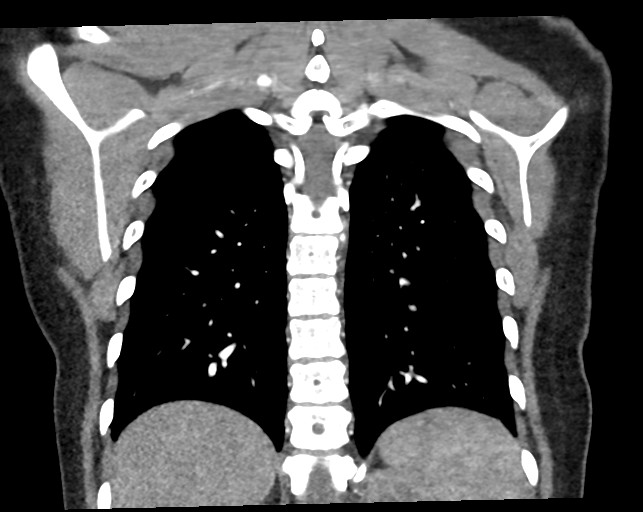

[18 of 46 positions shown; findings below may reference images not displayed]

RADIATION DOSE REDUCTION: This exam was performed according to the
departmental dose-optimization program which includes automated
exposure control, adjustment of the mA and/or kV according to
patient size and/or use of iterative reconstruction technique.

CONTRAST:  75mL OMNIPAQUE IOHEXOL 350 MG/ML SOLN
FINDINGS: Cardiovascular: The quality of this exam for evaluation of pulmonary
embolism is excellent. No evidence of pulmonary embolism.

Normal aortic caliber. Normal heart size, without pericardial
effusion.

Mediastinum/Nodes: No mediastinal or hilar adenopathy. Soft tissue
density in the anterior mediastinum is likely residual thymus.

Lungs/Pleura: No pleural fluid.  Clear lungs.

Upper Abdomen: Normal imaged portions of the liver, spleen, stomach,
pancreas, adrenal glands, left kidney.

Musculoskeletal: Probable nondisplaced fracture of the anterior left
sixth rib including on 103/4.

Review of the MIP images confirms the above findings.
IMPRESSION: 1.  No evidence of pulmonary embolism.
2. Probable nondisplaced anterior left sixth rib fracture. Correlate
with point tenderness.
